# Patient Record
Sex: Male | Born: 1970 | Race: White | Hispanic: No | Marital: Married | Smoking: Never smoker
Health system: Southern US, Community
[De-identification: ages and names within clinical notes are randomized; demographics above are authoritative.]

## PROBLEM LIST (undated history)

## (undated) DIAGNOSIS — T148XXA Other injury of unspecified body region, initial encounter: Secondary | ICD-10-CM

## (undated) DIAGNOSIS — T7840XA Allergy, unspecified, initial encounter: Secondary | ICD-10-CM

## (undated) DIAGNOSIS — F419 Anxiety disorder, unspecified: Secondary | ICD-10-CM

## (undated) DIAGNOSIS — J309 Allergic rhinitis, unspecified: Secondary | ICD-10-CM

## (undated) DIAGNOSIS — E785 Hyperlipidemia, unspecified: Secondary | ICD-10-CM

## (undated) DIAGNOSIS — L309 Dermatitis, unspecified: Secondary | ICD-10-CM

## (undated) DIAGNOSIS — K649 Unspecified hemorrhoids: Secondary | ICD-10-CM

## (undated) HISTORY — DX: Allergy, unspecified, initial encounter: T78.40XA

## (undated) HISTORY — DX: Dermatitis, unspecified: L30.9

## (undated) HISTORY — DX: Anxiety disorder, unspecified: F41.9

## (undated) HISTORY — DX: Allergic rhinitis, unspecified: J30.9

## (undated) HISTORY — DX: Other injury of unspecified body region, initial encounter: T14.8XXA

## (undated) HISTORY — DX: Unspecified hemorrhoids: K64.9

## (undated) HISTORY — PX: WISDOM TOOTH EXTRACTION: SHX21

## (undated) HISTORY — DX: Hyperlipidemia, unspecified: E78.5

---

## 1999-01-04 ENCOUNTER — Emergency Department (HOSPITAL_COMMUNITY): Admission: EM | Admit: 1999-01-04 | Discharge: 1999-01-04 | Payer: Self-pay | Admitting: Emergency Medicine

## 2003-08-25 HISTORY — PX: KNEE SURGERY: SHX244

## 2004-04-02 ENCOUNTER — Ambulatory Visit (HOSPITAL_BASED_OUTPATIENT_CLINIC_OR_DEPARTMENT_OTHER): Admission: RE | Admit: 2004-04-02 | Discharge: 2004-04-02 | Payer: Self-pay | Admitting: Orthopedic Surgery

## 2009-02-21 HISTORY — PX: ANTERIOR CRUCIATE LIGAMENT REPAIR: SHX115

## 2009-08-24 HISTORY — PX: VASECTOMY: SHX75

## 2014-08-02 ENCOUNTER — Other Ambulatory Visit (HOSPITAL_COMMUNITY): Payer: Self-pay | Admitting: Internal Medicine

## 2014-08-02 DIAGNOSIS — R0789 Other chest pain: Secondary | ICD-10-CM

## 2014-08-22 ENCOUNTER — Telehealth (HOSPITAL_COMMUNITY): Payer: Self-pay

## 2014-08-22 NOTE — Telephone Encounter (Signed)
Encounter complete. 

## 2014-08-28 ENCOUNTER — Ambulatory Visit (HOSPITAL_COMMUNITY)
Admission: RE | Admit: 2014-08-28 | Discharge: 2014-08-28 | Disposition: A | Discharge status: Home / Self Care | Payer: 59 | Source: Ambulatory Visit | Attending: Cardiovascular Disease | Admitting: Cardiovascular Disease

## 2014-08-28 DIAGNOSIS — R079 Chest pain, unspecified: Secondary | ICD-10-CM | POA: Diagnosis present

## 2014-08-28 DIAGNOSIS — R0789 Other chest pain: Secondary | ICD-10-CM

## 2014-08-28 NOTE — Procedures (Signed)
Exercise Treadmill Test  Test  Exercise Tolerance Test Ordering MD: Marton Redwood, MD  Interpreting MD:   Unique Test No: 1 Treadmill:  1  Indication for ETT: chest pain - rule out ischemia  Contraindication to ETT: No   Stress Modality: exercise - treadmill  Cardiac Imaging Performed: non   Protocol: standard Bruce - maximal  Max BP:  169/88  Max MPHR (bpm):  177 85% MPR (bpm):  150  MPHR obtained (bpm):  173 % MPHR obtained:  97  Reached 85% MPHR (min:sec):  9:55 Total Exercise Time (min-sec):  13:36  Workload in METS:  16.40 Borg Scale:   Reason ETT Terminated:  fatigue    ST Segment Analysis At Rest: NSR with no ST changes With Exercise: no evidence of significant ST depression  Other Information Arrhythmia:  Rare PVC. Angina during ETT:  absent (0) Quality of ETT:  diagnostic  ETT Interpretation:  normal - no evidence of ischemia by ST analysis  ETT with good exercise tolerance (13:36); normal BP response; no chest pain; no ST changes; negative adequate ETT.  Kirk Ruths

## 2014-09-10 ENCOUNTER — Encounter: Payer: Self-pay | Admitting: Neurology

## 2014-09-11 ENCOUNTER — Encounter: Payer: Self-pay | Admitting: Neurology

## 2014-09-11 ENCOUNTER — Ambulatory Visit (INDEPENDENT_AMBULATORY_CARE_PROVIDER_SITE_OTHER): Payer: 59 | Admitting: Neurology

## 2014-09-11 VITALS — BP 113/67 | HR 73 | Temp 97.9°F | Resp 180 | Ht 74.0 in | Wt 245.0 lb

## 2014-09-11 DIAGNOSIS — G479 Sleep disorder, unspecified: Secondary | ICD-10-CM

## 2014-09-11 DIAGNOSIS — R51 Headache: Secondary | ICD-10-CM

## 2014-09-11 DIAGNOSIS — G478 Other sleep disorders: Secondary | ICD-10-CM

## 2014-09-11 DIAGNOSIS — G4733 Obstructive sleep apnea (adult) (pediatric): Secondary | ICD-10-CM

## 2014-09-11 DIAGNOSIS — R519 Headache, unspecified: Secondary | ICD-10-CM

## 2014-09-11 NOTE — Progress Notes (Signed)
Subjective:    Patient ID: Robert Berg is a 44 y.o. male.  HPI     Star Age, MD, PhD [facility] [address] [place], [place] [address]  Dear Dr. Brigitte Pulse,  I saw your patient, Robert Berg, upon your kind request in my neurologic clinic today for initial consultation of his sleep disorder, in particular, concern for underlying obstructive sleep apnea. The patient is unaccompanied today. As you know, Robert Berg is a 44 year old right-handed gentleman with an underlying medical history of anxiety, hyperlipidemia, allergic rhinitis, eczema, elevated LFTs, and obesity, who reports snoring and non-restorative sleep. He has had weight gain in the last 10 years. He feels tired and reports work related stress. His ESS is 3/24.  His father has OSA and is on a CPAP. The patient himself is not excited about the prospect of perhaps having to use a CPAP machine. He occasionally wakes up with a headache. He typically does not wake up rested. Lately he has tried to improve his sleep habits. He has reduced his caffeine intake and usually drinks about 1-2 caffeinated beverages per day. He tries not to have any caffeine after 6 PM. He usually goes to bed around 11 PM but sometimes has trouble going to sleep. He has rumination of thoughts. He is never been a good sleeper. He wakes up at 6:45 usually. He rarely has nocturia. He denies any parasomnias. He denies restless leg symptoms and is not sure if he twitches in his sleep. He does not smoke. He drinks beer or wine 4 times a week. His wife has reported witnessed breathing pauses to him. He also has woken up with a sense of gasping for air.   His Past Medical History Is Significant For: Past Medical History  Diagnosis Date  . Hyperlipemia   . Allergic rhinitis   . Eczema   . Hemorrhoids   . Anxiety   . Fracture     arm, finger, collarbone, ankle(left)    His Past Surgical History Is Significant For: Past  Surgical History  Procedure Laterality Date  . Knee surgery Right 2005    left knee, 2004  . Wisdom tooth extraction    . Anterior cruciate ligament repair  07/10    torn  . Vasectomy  1/11    His Family History Is Significant For: Family History  Problem Relation Age of Onset  . Suicidality Brother   . Hyperlipidemia Father   . Hypertension Father   . Osteoarthritis Father   . Sleep apnea Father   . Parkinson's disease Father   . Depression Mother   . Cancer Maternal Grandmother     stomach  . Microcephaly Paternal Grandmother   . Microcephaly Paternal Grandmother     His Social History Is Significant For: History   Social History  . Marital Status: Married    Spouse Name: Micheala    Number of Children: 3  . Years of Education: 19   Occupational History  .      [employer]   Social History Main Topics  . Smoking status: Never Smoker   . Smokeless tobacco: Never Used  . Alcohol Use: 0.0 oz/week    0 Not specified per week     Comment: socially  . Drug Use: No  . Sexual Activity: None   Other Topics Concern  . None   Social History Narrative   Patient consumes 2 cups of caffeine daily  His Allergies Are:  Allergies  Allergen Reactions  . Seasonal Ic [Cholestatin]     Sinuses, cough, runny nose  :   His Current Medications Are:  Outpatient Encounter Prescriptions as of 09/11/2014  Medication Sig  . fluticasone (FLONASE) 50 MCG/ACT nasal spray Place 2 sprays into both nostrils daily.  . hydrocortisone (ANUSOL-HC) 2.5 % rectal cream Place 1 application rectally 2 (two) times daily.  Marland Kitchen loratadine (CLARITIN) 10 MG tablet Take 10 mg by mouth daily. As needed  . rosuvastatin (CRESTOR) 20 MG tablet Take 20 mg by mouth daily. 1/2 tablet at bedtime  . [DISCONTINUED] Lidocaine-Hydrocortisone Ace (ANAMANTLE HC RE) Place rectally. Apply 1 supp. Per rectum twice daily 14 days , then as needed  :  Review of Systems:  Out of a complete 14  point review of systems, all are reviewed and negative with the exception of these symptoms as listed below:   Review of Systems  Allergic/Immunologic: Positive for environmental allergies.  Neurological:       Snoring  Psychiatric/Behavioral:       Decreased energy    Objective:  Neurologic Exam  Physical Exam Physical Examination:   Filed Vitals:   09/11/14 0834  BP: 113/67  Pulse: 73  Temp: 97.9 F (36.6 C)  Resp: 180    General Examination: The patient is a very pleasant 44 y.o. male in no acute distress. He appears well-developed and well-nourished and well groomed.   HEENT: Normocephalic, atraumatic, pupils are equal, round and reactive to light and accommodation. Funduscopic exam is normal with sharp disc margins noted. Extraocular tracking is good without limitation to gaze excursion or nystagmus noted. Normal smooth pursuit is noted. Hearing is grossly intact. Tympanic membranes are clear bilaterally. Face is symmetric with normal facial animation and normal facial sensation. Speech is clear with no dysarthria noted. There is no hypophonia. There is no lip, neck/head, jaw or voice tremor. Neck is supple with full range of passive and active motion. There are no carotid bruits on auscultation. Oropharynx exam reveals: mild mouth dryness, good dental hygiene and moderate airway crowding, due to narrow airway entry, and larger tongue, tonsils are 1+ bilaterally and uvula is actually small . Mallampati is class II. Tongue protrudes centrally and palate elevates symmetrically.  Neck is 18-1/2 inches. Nasal inspection reveals no significant nasal mucosal bogginess or redness, but he has septal deviation to the right.  Chest: Clear to auscultation without wheezing, rhonchi or crackles noted.  Heart: S1+S2+0, regular and normal without murmurs, rubs or gallops noted.   Abdomen: Soft, non-tender and non-distended with normal bowel sounds appreciated on auscultation.  Extremities:  There is no pitting edema in the distal lower extremities bilaterally. Pedal pulses are intact.  Skin: Warm and dry without trophic changes noted. There are no varicose veins.  Musculoskeletal: exam reveals no obvious joint deformities, tenderness or joint swelling or erythema.   Neurologically:  Mental status: The patient is awake, alert and oriented in all 4 spheres. His immediate and remote memory, attention, language skills and fund of knowledge are appropriate. There is no evidence of aphasia, agnosia, apraxia or anomia. Speech is clear with normal prosody and enunciation. Thought process is linear. Mood is normal and affect is normal.  Cranial nerves II - XII are as described above under HEENT exam. In addition: shoulder shrug is normal with equal shoulder height noted. Motor exam: Normal bulk, strength and tone is noted. There is no drift, tremor or rebound. Romberg is negative. Reflexes are 2+  throughout. Babinski: Toes are flexor bilaterally. Fine motor skills and coordination: intact with normal finger taps, normal hand movements, normal rapid alternating patting, normal foot taps and normal foot agility.  Cerebellar testing: No dysmetria or intention tremor on finger to nose testing. Heel to shin is unremarkable bilaterally. There is no truncal or gait ataxia.  Sensory exam: intact to light touch, pinprick, vibration, temperature sense proprioception in the upper and lower extremities.  Gait, station and balance: He stands easily. No veering to one side is noted. No leaning to one side is noted. Posture is age-appropriate and stance is narrow based. Gait shows normal stride length and normal pace. No problems turning are noted. He turns en bloc. Tandem walk is unremarkable.    Assessment and Plan:   In summary, Robert Berg is a very pleasant 44 y.o.-year old male  with an underlying medical history of anxiety, hyperlipidemia, allergic rhinitis, eczema, elevated LFTs, and obesity, who  reports snoring, , witnessed apneas, and occasional morning headaches. His history, family history and physical exam are in keeping with obstructive sleep apnea (OSA). I had a long chat with the patient about my findings and the diagnosis of OSA, its prognosis and treatment options. We talked about medical treatments, surgical interventions and non-pharmacological approaches. I explained in particular the risks and ramifications of untreated moderate to severe OSA, especially with respect to developing cardiovascular disease down the Road, including congestive heart failure, difficult to treat hypertension, cardiac arrhythmias, or stroke. Even type 2 diabetes has, in part, been linked to untreated OSA. Symptoms of untreated OSA include daytime sleepiness, memory problems, mood irritability and mood disorder such as depression and anxiety, lack of energy, as well as recurrent headaches, especially morning headaches. We talked about trying to maintain a healthy lifestyle in general, as well as the importance of weight control. I encouraged the patient to eat healthy, exercise daily and keep well hydrated, to keep a scheduled bedtime and wake time routine, to not skip any meals and eat healthy snacks in between meals. I advised the patient not to drive when feeling sleepy. I recommended the following at this time: sleep study with potential positive airway pressure titration. (We will score hypopneas at 4% and split the sleep study into diagnostic and treatment portion, if the estimated. 2 hour AHI is >20/h).   I explained the sleep test procedure to the patient and also outlined possible surgical and non-surgical treatment options of OSA, including the use of a custom-made dental device (which would require a referral to a specialist dentist or oral surgeon), upper airway surgical options, such as pillar implants, radiofrequency surgery, tongue base surgery, and UPPP (which would involve a referral to an ENT  surgeon). Rarely, jaw surgery such as mandibular advancement may be considered.  I also explained the CPAP treatment option to the patient, who indicated that he would be reluctant but willing to at least try CPAP if the need arises. I explained the importance of being compliant with PAP treatment, not only for insurance purposes but primarily to improve His symptoms, and for the patient's long term health benefit, including to reduce His cardiovascular risks. I answered all his questions today and the patient was in agreement. I would like to see him back after the sleep study is completed and encouraged him to call with any interim questions, concerns, problems or updates.   Thank you very much for allowing me to participate in the care of this nice patient. If I can be  of any further assistance to you please do not hesitate to call me at [phone].  Sincerely,   Star Age, MD, PhD

## 2014-09-11 NOTE — Patient Instructions (Signed)

## 2014-10-09 ENCOUNTER — Ambulatory Visit (INDEPENDENT_AMBULATORY_CARE_PROVIDER_SITE_OTHER): Payer: 59 | Admitting: Neurology

## 2014-10-09 DIAGNOSIS — G4761 Periodic limb movement disorder: Secondary | ICD-10-CM

## 2014-10-09 DIAGNOSIS — G479 Sleep disorder, unspecified: Secondary | ICD-10-CM

## 2014-10-09 DIAGNOSIS — G4733 Obstructive sleep apnea (adult) (pediatric): Secondary | ICD-10-CM

## 2014-10-09 NOTE — Sleep Study (Signed)
Please see the scanned sleep study interpretation located in the Procedure tab within the Chart Review section. 

## 2014-10-17 ENCOUNTER — Telehealth: Payer: Self-pay | Admitting: Neurology

## 2014-10-17 NOTE — Telephone Encounter (Signed)
Please call and notify the patient that the recent sleep study showed overall mild obstructive sleep apnea and also leg kicking in sleep. Please inform patient that I would like to go over the details of the study and potential treatment options during a follow up appointment and if not already previously scheduled, arrange a followup appointment (please utilize a followu-up slot). Also, route or fax report to PCP and referring MD, if other than PCP.  Once you have spoken to patient, you can close this encounter.   Thanks,  Star Age, MD, PhD [facility])

## 2014-10-18 ENCOUNTER — Encounter: Payer: Self-pay | Admitting: Neurology

## 2014-10-23 NOTE — Telephone Encounter (Signed)
Called and left the patient a detailed message that Dr. Rexene Alberts wanted to see him in follow up to go over the details of his overnight sleep study.  Patient was instructed to call the office to do so.  Dr. Marton Redwood was faxed a copy of the results.

## 2014-10-24 ENCOUNTER — Telehealth: Payer: Self-pay | Admitting: *Deleted

## 2014-10-24 NOTE — Telephone Encounter (Signed)
Patient notified via VM to contact office to schedule an appointment with Dr. Rexene Alberts to go over the results of his sleep study.

## 2016-09-04 ENCOUNTER — Other Ambulatory Visit: Payer: Self-pay | Admitting: Internal Medicine

## 2016-09-04 DIAGNOSIS — R911 Solitary pulmonary nodule: Secondary | ICD-10-CM

## 2020-02-05 ENCOUNTER — Other Ambulatory Visit: Payer: Self-pay

## 2020-02-05 ENCOUNTER — Encounter: Payer: Self-pay | Admitting: Family Medicine

## 2020-02-05 ENCOUNTER — Ambulatory Visit: Payer: Self-pay

## 2020-02-05 ENCOUNTER — Ambulatory Visit (INDEPENDENT_AMBULATORY_CARE_PROVIDER_SITE_OTHER): Payer: 59 | Admitting: Family Medicine

## 2020-02-05 VITALS — BP 140/90 | HR 75 | Ht 74.0 in | Wt 243.0 lb

## 2020-02-05 DIAGNOSIS — M79605 Pain in left leg: Secondary | ICD-10-CM | POA: Diagnosis not present

## 2020-02-05 DIAGNOSIS — M7662 Achilles tendinitis, left leg: Secondary | ICD-10-CM | POA: Diagnosis not present

## 2020-02-05 MED ORDER — NITROGLYCERIN 0.2 MG/HR TD PT24: MEDICATED_PATCH | TRANSDERMAL | 1 refills | Status: DC

## 2020-02-05 NOTE — Progress Notes (Signed)
    Subjective:    CC: Left leg pain   I, Judy Pimple, am serving as a scribe for Dr. Lynne Leader.  HPI: patient is a 49 year old male presenting to [facility] for Left leg pain. Patient plays a lot of tennis  May 20th hurt his leg was sore in heel area and achilles tendon. Tried to play on the 22nd but hobbled around, played on may 23rd and on the 24th. On the 24th left leg pulled like a rubber band from achilles, calf and hamstring. Patient went in hot tub and a roller and stopped playing tennis. Achilles was tender to the touch. On June 5th tried to do a tennis clinic and could not explode off left foot. Patient was recommended to our office. Patient states ice is helpful but wants to know what he needs to do now, not sure if anything is torn.   Pertinent review of Systems: No fevers or chills  Relevant historical information: Hyperlipidemia   Objective:    Vitals:   02/05/20 1424  BP: 140/90  Pulse: 75  SpO2: 97%   General: Well Developed, well nourished, and in no acute distress.   MSK: Left Achilles tendon normal-appearing not particular tender palpation normal motion normal strength.  Lab and Radiology Results  Diagnostic Limited MSK Ultrasound of: Left Achilles tendon Achilles tendon insertion on calcaneus normal-appearing measuring 0.46 cm.  Hyperechoic calcification seen at distal tendon insertion consistent with heel spur. About 4 cm proximal to insertion of calcaneus tendon widens measuring 1 cm consistent in appearance with tendon nodule. No visible tear seen at nodule.  Normal appearance of Achilles tendon and origin at muscle tendinous junction gastrocnemius Impression: Achilles tendon nodule    Impression and Recommendations:    Assessment and Plan: 49 y.o. male with left heel pain significant for Achilles tendon nodule/tendinitis.  No obvious tear on ultrasound today.  Plan for nitroglycerin patch protocol and home exercise  program.  Recheck back in a month.Marland Kitchen  PDMP not reviewed this encounter. Orders Placed This Encounter  Procedures  . [place] LIMITED JOINT SPACE STRUCTURES LOW LEFT    Standing Status:   Future    Number of Occurrences:   1    Standing Expiration Date:   02/04/2021    Order Specific Question:   Reason for Exam (SYMPTOM  OR DIAGNOSIS REQUIRED)    Answer:   Left leg pain    Order Specific Question:   Preferred imaging location?    Answer:   [facility]   Meds ordered this encounter  Medications  . nitroGLYCERIN (NITRODUR - DOSED IN MG/24 HR) 0.2 mg/hr patch    Sig: Apply 1/4 patch daily to tendon for tendonitis.    Dispense:  30 patch    Refill:  1    Discussed warning signs or symptoms. Please see discharge instructions. Patient expresses understanding.   The above documentation has been reviewed and is accurate and complete Lynne Leader, M.D.

## 2020-02-05 NOTE — Patient Instructions (Signed)
Thank you for coming in today.  Plan for exercises and nitro patches.  Do 30 reps 2-3x daily Remember to go from up to down slowly.  Recheck in about 1 month.   Nitroglycerin Protocol   Apply 1/4 nitroglycerin patch to affected area daily.  Change position of patch within the affected area every 24 hours.  You may experience a headache during the first 1-2 weeks of using the patch, these should subside.  If you experience headaches after beginning nitroglycerin patch treatment, you may take your preferred over the counter pain reliever.  Another side effect of the nitroglycerin patch is skin irritation or rash related to patch adhesive.  Please notify our office if you develop more severe headaches or rash, and stop the patch.  Tendon healing with nitroglycerin patch may require 12 to 24 weeks depending on the extent of injury.  Men should not use if taking Viagra, Cialis, or Levitra.   Do not use if you have migraines or rosacea.    Rosen's Emergency Medicine: Concepts and Clinical Practice (9th ed., pp. 7253-6644). [place], [place]: [place]. Retrieved from https://www.clinicalkey.com/#!/content/book/3-s2.0-B9780323354790001070?scrollTo=%23hl0000251">  Achilles Tendinitis  Achilles tendinitis is inflammation of the tough, cord-like band that attaches the lower leg muscles to the heel bone (Achilles tendon). This is usually caused by overusing the tendon and the ankle joint. Achilles tendinitis usually gets better over time with treatment and caring for yourself at home. It can take weeks or months to heal completely. What are the causes? This condition may be caused by:  A sudden increase in exercise or activity, such as running.  Doing the same exercises or activities, such as jumping, over and over.  Not warming up calf muscles before exercising.  Exercising in shoes that are worn out or not made for exercise.  Having arthritis or a bone growth (spur) on the back  of the heel bone. This can rub against the tendon and hurt it.  Age-related wear and tear. Tendons become less flexible with age and are more likely to be injured. What are the signs or symptoms? Common symptoms of this condition include:  Pain in the Achilles tendon or in the back of the leg, just above the heel. The pain usually gets worse with exercise.  Stiffness or soreness in the back of the leg, especially in the morning.  Swelling of the skin over the Achilles tendon.  Thickening of the tendon.  Trouble standing on tiptoe. How is this diagnosed? This condition is diagnosed based on your symptoms and a physical exam. You may have tests, including:  X-rays.  MRI. How is this treated? The goal of treatment is to relieve symptoms and help your injury heal. Treatment may include:  Decreasing or stopping activities that caused the tendinitis. This may mean switching to low-impact exercises like biking or swimming.  Icing the injured area.  Doing physical therapy, including strengthening and stretching exercises.  Taking NSAIDs, such as ibuprofen, to help relieve pain and swelling.  Using supportive shoes, wraps, heel lifts, or a walking boot (air cast).  Having surgery. This may be done if your symptoms do not improve after other treatments.  Using high-energy shock wave impulses to stimulate the healing process (extracorporeal shock wave therapy). This is rare.  Having an injection of medicines that help relieve inflammation (corticosteroids). This is rare. Follow these instructions at home: If you have an air cast:  Wear the air cast as told by your health care provider. Remove it only as told  by your health care provider.  Loosen it if your toes tingle, become numb, or turn cold and blue.  Keep it clean.  If the air cast is not waterproof: ? Do not let it get wet. ? Cover it with a watertight covering when you take a bath or shower. Managing pain, stiffness,  and swelling   If directed, put ice on the injured area. To do this: ? If you have a removable air cast, remove it as told by your health care provider. ? Put ice in a plastic bag. ? Place a towel between your skin and the bag. ? Leave the ice on for 20 minutes, 2-3 times a day.  Move your toes often to reduce stiffness and swelling.  Raise (elevate) your foot above the level of your heart while you are sitting or lying down. Activity  Gradually return to your normal activities as told by your health care provider. Ask your health care provider what activities are safe for you.  Do not do activities that cause pain.  Consider doing low-impact exercises, like cycling or swimming.  Ask your health care provider when it is safe to drive if you have an air cast on your foot.  If physical therapy was prescribed, do exercises as told by your health care provider or physical therapist. General instructions  If directed, wrap your foot with an elastic bandage or other wrap. This can help to keep your tendon from moving too much while it heals. Your health care provider will show you how to wrap your foot correctly.  Wear supportive shoes or heel lifts only as told by your health care provider.  Take over-the-counter and prescription medicines only as told by your health care provider.  Keep all follow-up visits as told by your health care provider. This is important. Contact a health care provider if you:  Have symptoms that get worse.  Have pain that does not get better with medicine.  Develop new, unexplained symptoms.  Develop warmth and swelling in your foot.  Have a fever. Get help right away if you:  Have a sudden popping sound or sensation in your Achilles tendon followed by severe pain.  Cannot move your toes or foot.  Cannot put any weight on your foot.  Your foot or toes become numb and look white or blue even after loosening your bandage or air  cast. Summary  Achilles tendinitis is inflammation of the tough, cord-like band that attaches the lower leg muscles to the heel bone (Achilles tendon).  This condition is usually caused by overusing the tendon and the ankle joint. It can also be caused by arthritis or normal aging.  The most common symptoms of this condition include pain, swelling, or stiffness in the Achilles tendon or in the back of the leg.  This condition is usually treated by decreasing or stopping activities that caused the tendinitis, icing the injured area, taking NSAIDs, and doing physical therapy. This information is not intended to replace advice given to you by your health care provider. Make sure you discuss any questions you have with your health care provider. Document Revised: 12/26/2018 Document Reviewed: 12/26/2018 Elsevier Patient Education  [place].

## 2020-03-06 ENCOUNTER — Encounter: Payer: Self-pay | Admitting: Family Medicine

## 2020-03-06 ENCOUNTER — Other Ambulatory Visit: Payer: Self-pay

## 2020-03-06 ENCOUNTER — Ambulatory Visit (INDEPENDENT_AMBULATORY_CARE_PROVIDER_SITE_OTHER): Payer: 59 | Admitting: Family Medicine

## 2020-03-06 ENCOUNTER — Ambulatory Visit: Payer: Self-pay

## 2020-03-06 VITALS — BP 100/74 | HR 66 | Ht 74.0 in | Wt 248.6 lb

## 2020-03-06 DIAGNOSIS — M7661 Achilles tendinitis, right leg: Secondary | ICD-10-CM

## 2020-03-06 DIAGNOSIS — M7662 Achilles tendinitis, left leg: Secondary | ICD-10-CM

## 2020-03-06 NOTE — Patient Instructions (Signed)
Thank you for coming in today. Keep working on the exercises.  Ok to use the nitro patches off and on.  Recheck back with me as needed for this issue or other MSK issues.

## 2020-03-06 NOTE — Progress Notes (Signed)
   I, Wendy Poet, LAT, ATC, am serving as scribe for Dr. Lynne Leader.  Robert Berg is a 49 y.o. male who presents to [place] at [facility] today for f/u of L leg (Achille's, calf, hamstring) pain.  He was last seen by Dr. Georgina Snell on 02/05/20 after having injured himself while playing tennis.  He was prescribed nitroglycerin patches and was shown Alfredson's exercises per Dr. Georgina Snell.  Since his last visit, pt reports that his L leg is feeling much better.  However, now he reports having issues in his R Achille's.  He has resumed playing tennis, 2x last week and 2x this week.  The R Achille's began bothering him this week.  He is no longer using the nitro patches as his symptoms improved.   Pertinent review of systems: No fevers or chills  Relevant historical information: History anxiety   Exam:  BP 100/74 (BP Location: Left Arm, Patient Position: Sitting, Cuff Size: Large)   Pulse 66   Ht 6\' 2"  (1.88 m)   Wt 248 lb 9.6 oz (112.8 kg)   SpO2 99%   BMI 31.92 kg/m  General: Well Developed, well nourished, and in no acute distress.   MSK: Right Achilles tendon with small visible nodule just proximal to Achilles tendon insertion.  Nontender normal ankle motion and strength.  Left Achilles tendon with small visible nodule just proximal to the Achilles tendon insertion.  Nontender normal ankle motion and strength.    Lab and Radiology Results Diagnostic Limited MSK Ultrasound of: Bilateral Achilles tendon Small nodule increased thickness at Achilles tendon proximal to insertion left worse than right.  No tendon disruption or significant increase Doppler activity. Impression: Achilles tendon nodule bilaterally left worse than right     Assessment and Plan: 48 y.o. 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  Improved with eccentric exercises and nitroglycerin patch protocol.  Stressed the importance of ongoing continued exercise and use of nitro  patches if symptoms worsen again in the future.  Recheck back as needed.   PDMP not reviewed this encounter. Orders Placed This Encounter  Procedures  . [place] LIMITED JOINT SPACE STRUCTURES LOW RIGHT(NO LINKED CHARGES)    Order Specific Question:   Reason for Exam (SYMPTOM  OR DIAGNOSIS REQUIRED)    Answer:   R Achille's pain    Order Specific Question:   Preferred imaging location?    Answer:   [place]   No orders of the defined types were placed in this encounter.    Discussed warning signs or symptoms. Please see discharge instructions. Patient expresses understanding.   The above documentation has been reviewed and is accurate and complete Lynne Leader, M.D.

## 2020-05-05 ENCOUNTER — Encounter (HOSPITAL_COMMUNITY): Payer: Self-pay | Admitting: Emergency Medicine

## 2020-05-05 ENCOUNTER — Emergency Department (HOSPITAL_COMMUNITY)
Admission: EM | Admit: 2020-05-05 | Discharge: 2020-05-05 | Disposition: A | Discharge status: Home / Self Care | Payer: 59 | Attending: Emergency Medicine | Admitting: Emergency Medicine

## 2020-05-05 ENCOUNTER — Emergency Department (HOSPITAL_COMMUNITY): Payer: 59

## 2020-05-05 ENCOUNTER — Other Ambulatory Visit: Payer: Self-pay

## 2020-05-05 DIAGNOSIS — W19XXXA Unspecified fall, initial encounter: Secondary | ICD-10-CM | POA: Insufficient documentation

## 2020-05-05 DIAGNOSIS — M549 Dorsalgia, unspecified: Secondary | ICD-10-CM | POA: Insufficient documentation

## 2020-05-05 DIAGNOSIS — Z5321 Procedure and treatment not carried out due to patient leaving prior to being seen by health care provider: Secondary | ICD-10-CM | POA: Insufficient documentation

## 2020-05-05 DIAGNOSIS — Y9373 Activity, racquet and hand sports: Secondary | ICD-10-CM | POA: Diagnosis not present

## 2020-05-05 DIAGNOSIS — Y929 Unspecified place or not applicable: Secondary | ICD-10-CM | POA: Diagnosis not present

## 2020-05-05 DIAGNOSIS — Y999 Unspecified external cause status: Secondary | ICD-10-CM | POA: Diagnosis not present

## 2020-05-05 NOTE — ED Notes (Signed)
According to Dr. Joylene Draft; with whom our C.N. spoke, pt. Was to have an outpatient x-ray ONLY--no E.D. visit. I will dismiss this chart accordingly now.

## 2020-05-05 NOTE — ED Triage Notes (Signed)
Patient reports falling on left side of back while playing tennis. Reports left side back pain. "I think I have broken ribs".

## 2020-08-29 DIAGNOSIS — Z125 Encounter for screening for malignant neoplasm of prostate: Secondary | ICD-10-CM | POA: Diagnosis not present

## 2020-08-29 DIAGNOSIS — Z0000 Encounter for general adult medical examination without abnormal findings: Secondary | ICD-10-CM | POA: Diagnosis not present

## 2020-08-29 DIAGNOSIS — E785 Hyperlipidemia, unspecified: Secondary | ICD-10-CM | POA: Diagnosis not present

## 2020-09-04 DIAGNOSIS — E785 Hyperlipidemia, unspecified: Secondary | ICD-10-CM | POA: Diagnosis not present

## 2020-09-04 DIAGNOSIS — Z1339 Encounter for screening examination for other mental health and behavioral disorders: Secondary | ICD-10-CM | POA: Diagnosis not present

## 2020-09-04 DIAGNOSIS — Z23 Encounter for immunization: Secondary | ICD-10-CM | POA: Diagnosis not present

## 2020-09-04 DIAGNOSIS — Z1331 Encounter for screening for depression: Secondary | ICD-10-CM | POA: Diagnosis not present

## 2020-09-04 DIAGNOSIS — Z0000 Encounter for general adult medical examination without abnormal findings: Secondary | ICD-10-CM | POA: Diagnosis not present

## 2020-09-04 DIAGNOSIS — R82998 Other abnormal findings in urine: Secondary | ICD-10-CM | POA: Diagnosis not present

## 2020-09-04 DIAGNOSIS — R7401 Elevation of levels of liver transaminase levels: Secondary | ICD-10-CM | POA: Diagnosis not present

## 2020-09-04 DIAGNOSIS — Z1212 Encounter for screening for malignant neoplasm of rectum: Secondary | ICD-10-CM | POA: Diagnosis not present

## 2021-04-07 ENCOUNTER — Encounter: Payer: Self-pay | Admitting: Gastroenterology

## 2021-04-29 ENCOUNTER — Other Ambulatory Visit: Payer: Self-pay

## 2021-04-29 ENCOUNTER — Ambulatory Visit (AMBULATORY_SURGERY_CENTER): Payer: BC Managed Care – PPO | Admitting: *Deleted

## 2021-04-29 VITALS — Ht 74.0 in | Wt 240.0 lb

## 2021-04-29 DIAGNOSIS — Z1211 Encounter for screening for malignant neoplasm of colon: Secondary | ICD-10-CM

## 2021-04-29 MED ORDER — NA SULFATE-K SULFATE-MG SULF 17.5-3.13-1.6 GM/177ML PO SOLN: 1.0000 | ORAL | 0 refills | Status: DC

## 2021-04-29 NOTE — Progress Notes (Signed)
Patient's pre-visit was done today over the phone with the patient due to COVID-19 pandemic. Name,DOB and address verified. Insurance verified. Patient denies any allergies to Eggs and Soy. Patient denies any problems with anesthesia/sedation. Patient denies taking diet pills or blood thinners. No home Oxygen. Packet of Prep instructions mailed to patient including a copy of a consent form-pt is aware. Patient understands to call us back with any questions or concerns. Patient is aware of our care-partner policy and 0000000 safety protocol.   The patient is COVID-19 vaccinated.

## 2021-05-02 ENCOUNTER — Encounter: Payer: Self-pay | Admitting: Gastroenterology

## 2021-05-13 ENCOUNTER — Other Ambulatory Visit: Payer: Self-pay

## 2021-05-13 ENCOUNTER — Ambulatory Visit (AMBULATORY_SURGERY_CENTER): Payer: BC Managed Care – PPO | Admitting: Gastroenterology

## 2021-05-13 ENCOUNTER — Encounter: Payer: Self-pay | Admitting: Gastroenterology

## 2021-05-13 VITALS — BP 118/82 | HR 65 | Temp 97.1°F | Resp 17 | Ht 74.0 in | Wt 240.0 lb

## 2021-05-13 DIAGNOSIS — D122 Benign neoplasm of ascending colon: Secondary | ICD-10-CM | POA: Diagnosis not present

## 2021-05-13 DIAGNOSIS — D123 Benign neoplasm of transverse colon: Secondary | ICD-10-CM | POA: Diagnosis not present

## 2021-05-13 DIAGNOSIS — Z1211 Encounter for screening for malignant neoplasm of colon: Secondary | ICD-10-CM | POA: Diagnosis not present

## 2021-05-13 MED ORDER — SODIUM CHLORIDE 0.9 % IV SOLN: 500.0000 mL | Freq: Once | INTRAVENOUS | Status: DC

## 2021-05-13 NOTE — Op Note (Signed)
[place] Patient Name: Robert Berg Procedure Date: 05/13/2021 8:30 AM MRN: [id number] Endoscopist: Ladene Artist , MD Age: 50 Referring MD:  Date of Birth: Aug 18, 1971 Gender: Male Account #: [id number] Procedure:                Colonoscopy Indications:              Screening for colorectal malignant neoplasm Medicines:                Monitored Anesthesia Care Procedure:                Pre-Anesthesia Assessment:                           - Prior to the procedure, a History and Physical                            was performed, and patient medications and                            allergies were reviewed. The patient's tolerance of                            previous anesthesia was also reviewed. The risks                            and benefits of the procedure and the sedation                            options and risks were discussed with the patient.                            All questions were answered, and informed consent                            was obtained. Prior Anticoagulants: The patient has                            taken no previous anticoagulant or antiplatelet                            agents. ASA Grade Assessment: II - A patient with                            mild systemic disease. After reviewing the risks                            and benefits, the patient was deemed in                            satisfactory condition to undergo the procedure.                           After obtaining informed consent, the colonoscope  was passed under direct vision. Throughout the                            procedure, the patient's blood pressure, pulse, and                            oxygen saturations were monitored continuously. The                            CF [id number] #[id number] was introduced through the anus                            and advanced to the the cecum, identified by                            appendiceal orifice  and ileocecal valve. The                            ileocecal valve, appendiceal orifice, and rectum                            were photographed. The quality of the bowel                            preparation was excellent. The colonoscopy was                            performed without difficulty. The patient tolerated                            the procedure well. Scope In: 8:36:46 AM Scope Out: 8:48:55 AM Scope Withdrawal Time: 0 hours 10 minutes 12 seconds  Total Procedure Duration: 0 hours 12 minutes 9 seconds  Findings:                 The perianal and digital rectal examinations were                            normal.                           Two sessile polyps were found in the transverse                            colon and ascending colon. The polyps were 7 mm in                            size. These polyps were removed with a cold snare.                            Resection and retrieval were complete.                           A few small-mouthed diverticula were found in the  left colon. There was no evidence of diverticular                            bleeding.                           Internal hemorrhoids were found during                            retroflexion. The hemorrhoids were small and Grade                            I (internal hemorrhoids that do not prolapse).                           The exam was otherwise without abnormality on                            direct and retroflexion views. Complications:            No immediate complications. Estimated blood loss:                            None. Estimated Blood Loss:     Estimated blood loss: none. Impression:               - Two 7 mm polyps in the transverse colon and in                            the ascending colon, removed with a cold snare.                            Resected and retrieved.                           - Mild diverticulosis in the left colon.                            - Internal hemorrhoids.                           - The examination was otherwise normal on direct                            and retroflexion views. Recommendation:           - Repeat colonoscopy after studies are complete for                            surveillance based on pathology results.                           - Patient has a contact number available for                            emergencies. The signs and symptoms of potential  delayed complications were discussed with the                            patient. Return to normal activities tomorrow.                            Written discharge instructions were provided to the                            patient.                           - High fiber diet.                           - Continue present medications.                           - Await pathology results. Ladene Artist, MD 05/13/2021 8:52:30 AM This report has been signed electronically.

## 2021-05-13 NOTE — Progress Notes (Signed)
History & Physical  Primary Care Physician:  Ginger Organ., MD Primary Gastroenterologist: Jerilynn Mages. Fuller Plan, MD  CHIEF COMPLAINT:  CRC screening   HPI: Robert Berg is a 50 y.o. male, average risk CRC screening, who presents for colonoscopy.  No ongoing gastrointestinal complaints.  This is his first colonoscopy.   Past Medical History:  Diagnosis Date   Allergic rhinitis    Allergy    Anxiety    Eczema    Fracture    arm, finger, collarbone, ankle(left)   Hemorrhoids    Hyperlipemia     Past Surgical History:  Procedure Laterality Date   ANTERIOR CRUCIATE LIGAMENT REPAIR  07/10   torn   KNEE SURGERY Right 2005   left knee, 2004   VASECTOMY  1/11   WISDOM TOOTH EXTRACTION      Prior to Admission medications   Medication Sig Start Date End Date Taking? Authorizing Provider  loratadine (CLARITIN) 10 MG tablet Take 10 mg by mouth daily. As needed   Yes [provider]  rosuvastatin (CRESTOR) 20 MG tablet Take 20 mg by mouth daily. 1/2 tablet at bedtime   Yes [provider]  fluticasone (FLONASE) 50 MCG/ACT nasal spray Place 2 sprays into both nostrils daily.    [provider]  hydrocortisone (ANUSOL-HC) 2.5 % rectal cream Place 1 application rectally 2 (two) times daily. Patient not taking: Reported on 05/13/2021    [provider]    Current Outpatient Medications  Medication Sig Dispense Refill   loratadine (CLARITIN) 10 MG tablet Take 10 mg by mouth daily. As needed     rosuvastatin (CRESTOR) 20 MG tablet Take 20 mg by mouth daily. 1/2 tablet at bedtime     fluticasone (FLONASE) 50 MCG/ACT nasal spray Place 2 sprays into both nostrils daily.     hydrocortisone (ANUSOL-HC) 2.5 % rectal cream Place 1 application rectally 2 (two) times daily. (Patient not taking: Reported on 05/13/2021)     Current Facility-Administered Medications  Medication Dose Route Frequency Provider Last Rate Last Admin   0.9 %  sodium chloride infusion   500 mL Intravenous Once Ladene Artist, MD        Allergies as of 05/13/2021 - Review Complete 05/13/2021  Allergen Reaction Noted   Seasonal ic [cholestatin]  09/11/2014    Family History  Problem Relation Age of Onset   Depression Mother    Hyperlipidemia Father    Hypertension Father    Osteoarthritis Father    Sleep apnea Father    Parkinson's disease Father    Suicidality Brother    Cancer Maternal Grandmother        stomach   Microcephaly Paternal Grandmother    Colon cancer Neg Hx    Colon polyps Neg Hx    Esophageal cancer Neg Hx    Stomach cancer Neg Hx    Rectal cancer Neg Hx     Social History   Socioeconomic History   Marital status: Married    Spouse name: Micheala   Number of children: 3   Years of education: 19   Highest education level: Not on file  Occupational History    Comment: [employer]  Tobacco Use   Smoking status: Never   Smokeless tobacco: Never  Vaping Use   Vaping Use: Never used  Substance and Sexual Activity   Alcohol use: Yes    Alcohol/week: 4.0 - 6.0 standard drinks    Types: 4 - 6 Standard drinks or  equivalent per week    Comment: socially   Drug use: No   Sexual activity: Not on file  Other Topics Concern   Not on file  Social History Narrative   Patient consumes 2 cups of caffeine daily   Social Determinants of Health   Financial Resource Strain: Not on file  Food Insecurity: Not on file  Transportation Needs: Not on file  Physical Activity: Not on file  Stress: Not on file  Social Connections: Not on file  Intimate Partner Violence: Not on file    Review of Systems:  All systems reviewed an negative except where noted in HPI.  Gen: Denies any fever, chills, sweats, anorexia, fatigue, weakness, malaise, weight loss, and sleep disorder CV: Denies chest pain, angina, palpitations, syncope, orthopnea, PND, peripheral edema, and claudication. Resp: Denies dyspnea at rest, dyspnea with  exercise, cough, sputum, wheezing, coughing up blood, and pleurisy. GI: Denies vomiting blood, jaundice, and fecal incontinence.   Denies dysphagia or odynophagia. GU : Denies urinary burning, blood in urine, urinary frequency, urinary hesitancy, nocturnal urination, and urinary incontinence. MS: Denies joint pain, limitation of movement, and swelling, stiffness, low back pain, extremity pain. Denies muscle weakness, cramps, atrophy.  Derm: Denies rash, itching, dry skin, hives, moles, warts, or unhealing ulcers.  Psych: Denies depression, anxiety, memory loss, suicidal ideation, hallucinations, paranoia, and confusion. Heme: Denies bruising, bleeding, and enlarged lymph nodes. Neuro:  Denies any headaches, dizziness, paresthesias. Endo:  Denies any problems with DM, thyroid, adrenal function.   Physical Exam: General:  Alert, well-developed, in NAD Head:  Normocephalic and atraumatic. Eyes:  Sclera clear, no icterus.   Conjunctiva pink. Ears:  Normal auditory acuity. Mouth:  No deformity or lesions.  Neck:  Supple; no masses . Lungs:  Clear throughout to auscultation.   No wheezes, crackles, or rhonchi. No acute distress. Heart:  Regular rate and rhythm; no murmurs. Abdomen:  Soft, nondistended, nontender. No masses, hepatomegaly. No obvious masses.  Normal bowel .    Rectal:  Deferred   Msk:  Symmetrical without gross deformities.. Pulses:  Normal pulses noted. Extremities:  Without edema. Neurologic:  Alert and  oriented x4;  grossly normal neurologically. Skin:  Intact without significant lesions or rashes. Cervical Nodes:  No significant cervical adenopathy. Psych:  Alert and cooperative. Normal mood and affect.   Impression / Plan:   CRC screening average risk for colonoscopy.   This patient is appropriate for endoscopic procedures in the ambulatory setting.    Pricilla Riffle. Fuller Plan  05/13/2021, 8:32 AM

## 2021-05-13 NOTE — Progress Notes (Signed)
VS- Robert Berg  Pt's states no medical or surgical changes since previsit or office visit.  

## 2021-05-13 NOTE — Progress Notes (Signed)
Called to room to assist during endoscopic procedure.  Patient ID and intended procedure confirmed with present staff. Received instructions for my participation in the procedure from the performing physician.  

## 2021-05-13 NOTE — Progress Notes (Signed)
Report to PACU, RN, vss, BBS= Clear.  

## 2021-05-13 NOTE — Progress Notes (Signed)
Vitals not carrying over

## 2021-05-13 NOTE — Progress Notes (Signed)
No problems noted in the recovery room. maw 

## 2021-05-13 NOTE — Patient Instructions (Addendum)
Handouts were given to your care partner on polyps, diverticulosis, hemorrhoids, and a high fiber diet with liberal fluid intake. You may resume your current medications today. Await biopsy results.  May take 1-3 weeks to receive pathology results. Please call if any questions or concerns.      YOU HAD AN ENDOSCOPIC PROCEDURE TODAY AT [facility]:   Refer to the procedure report that was given to you for any specific questions about what was found during the examination.  If the procedure report does not answer your questions, please call your gastroenterologist to clarify.  If you requested that your care partner not be given the details of your procedure findings, then the procedure report has been included in a sealed envelope for you to review at your convenience later.  YOU SHOULD EXPECT: Some feelings of bloating in the abdomen. Passage of more gas than usual.  Walking can help get rid of the air that was put into your GI tract during the procedure and reduce the bloating. If you had a lower endoscopy (such as a colonoscopy or flexible sigmoidoscopy) you may notice spotting of blood in your stool or on the toilet paper. If you underwent a bowel prep for your procedure, you may not have a normal bowel movement for a few days.  Please Note:  You might notice some irritation and congestion in your nose or some drainage.  This is from the oxygen used during your procedure.  There is no need for concern and it should clear up in a day or so.  SYMPTOMS TO REPORT IMMEDIATELY:  Following lower endoscopy (colonoscopy or flexible sigmoidoscopy):  Excessive amounts of blood in the stool  Significant tenderness or worsening of abdominal pains  Swelling of the abdomen that is new, acute  Fever of 100F or higher  For urgent or emergent issues, a gastroenterologist can be reached at any hour by calling [phone]. Do not use MyChart messaging for urgent concerns.    DIET:  We  do recommend a small meal at first, but then you may proceed to your regular diet.  Drink plenty of fluids but you should avoid alcoholic beverages for 24 hours.  ACTIVITY:  You should plan to take it easy for the rest of today and you should NOT DRIVE or use heavy machinery until tomorrow (because of the sedation medicines used during the test).    FOLLOW UP: Our staff will call the number listed on your records 48-72 hours following your procedure to check on you and address any questions or concerns that you may have regarding the information given to you following your procedure. If we do not reach you, we will leave a message.  We will attempt to reach you two times.  During this call, we will ask if you have developed any symptoms of COVID 19. If you develop any symptoms (ie: fever, flu-like symptoms, shortness of breath, cough etc.) before then, please call [phone].  If you test positive for Covid 19 in the 2 weeks post procedure, please call and report this information to [place].    If any biopsies were taken you will be contacted by phone or by letter within the next 1-3 weeks.  Please call us at [phone] if you have not heard about the biopsies in 3 weeks.    SIGNATURES/CONFIDENTIALITY: You and/or your care partner have signed paperwork which will be entered into your electronic medical record.  These signatures attest to the fact that  that the information above on your After Visit Summary has been reviewed and is understood.  Full responsibility of the confidentiality of this discharge information lies with you and/or your care-partner.

## 2021-05-15 ENCOUNTER — Telehealth: Payer: Self-pay

## 2021-05-15 NOTE — Telephone Encounter (Signed)
First post procedure follow up call, no answer 

## 2021-05-15 NOTE — Telephone Encounter (Signed)
  Follow up Call-  Call back number 05/13/2021  Post procedure Call Back phone  # [phone]  Permission to leave phone message Yes  Some recent data might be hidden     Patient questions:  Do you have a fever, pain , or abdominal swelling? No. Pain Score  0 *  Have you tolerated food without any problems? Yes.    Have you been able to return to your normal activities? Yes.    Do you have any questions about your discharge instructions: Diet   No. Medications  No. Follow up visit  No.  Do you have questions or concerns about your Care? No.  Actions: * If pain score is 4 or above: No action needed, pain <4. Have you developed a fever since your procedure? no  2.   Have you had an respiratory symptoms (SOB or cough) since your procedure? no  3.   Have you tested positive for COVID 19 since your procedure no  4.   Have you had any family members/close contacts diagnosed with the COVID 19 since your procedure?  no   If yes to any of these questions please route to Joylene John, RN and Joella Prince, RN

## 2021-05-27 ENCOUNTER — Encounter: Payer: Self-pay | Admitting: Gastroenterology

## 2022-05-09 IMAGING — CR DG RIBS W/ CHEST 3+V*L*
5 series · 5 of 5 positions shown · non-contrast
Comparison: None.

CLINICAL DATA: Left posterior rib pain after falling onto his back
while playing tennis.

EXAM:
LEFT RIBS AND CHEST - 3+ VIEW

[w chest pa]
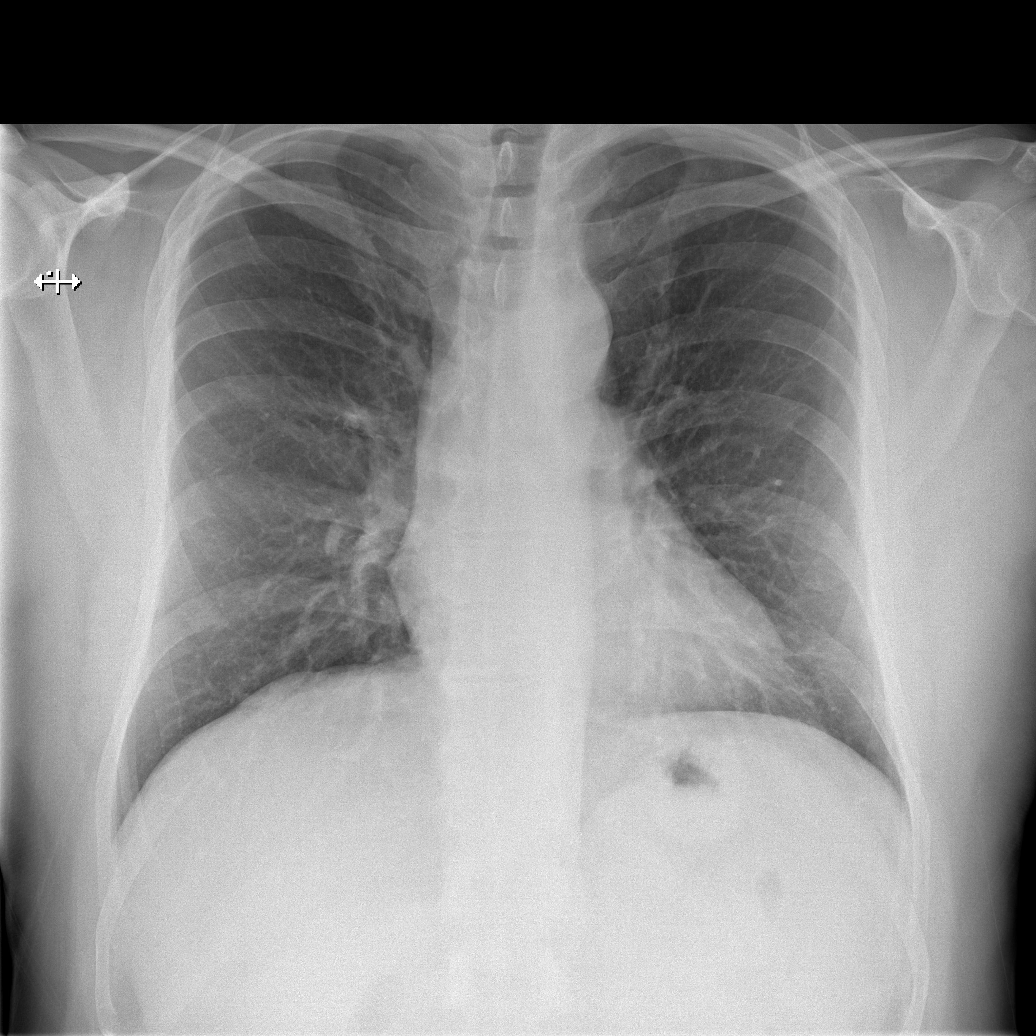

[w ribs ap upper left]
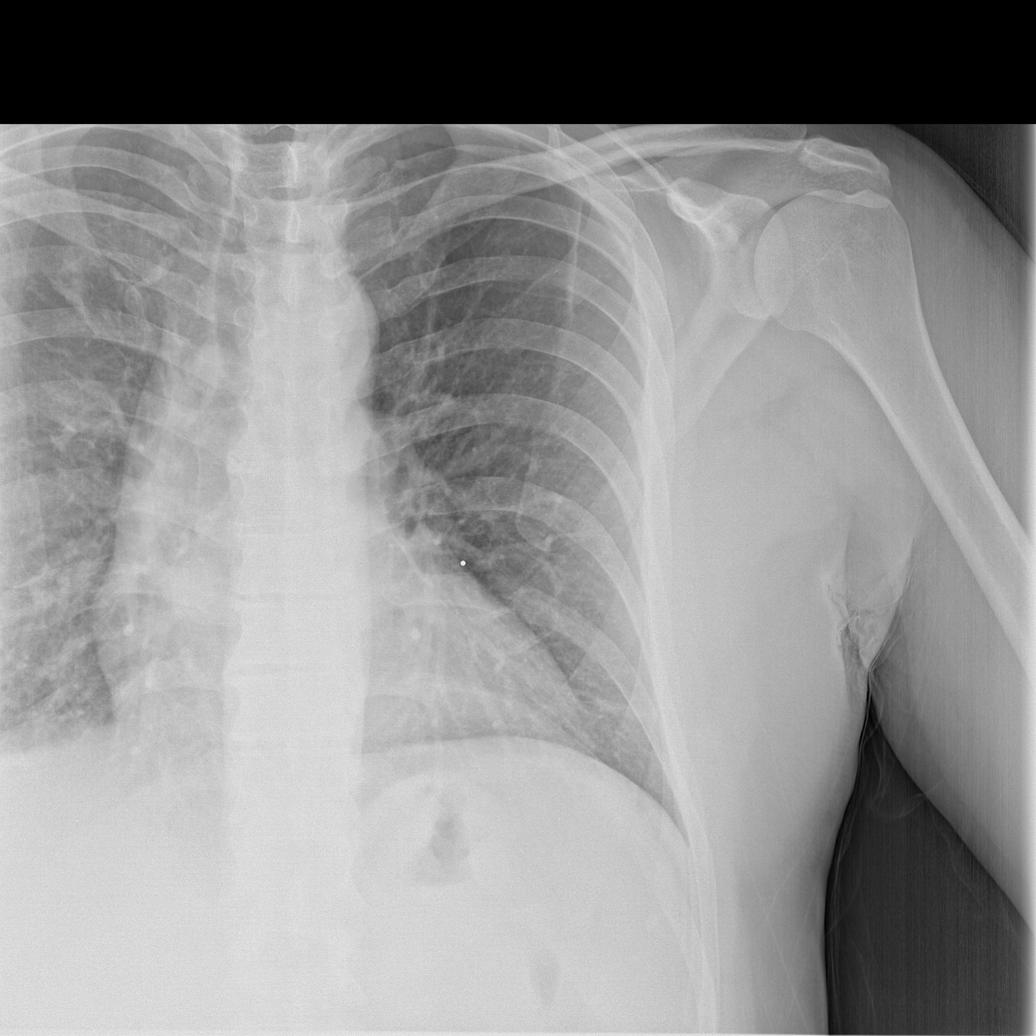

[w ribs ap lower left]
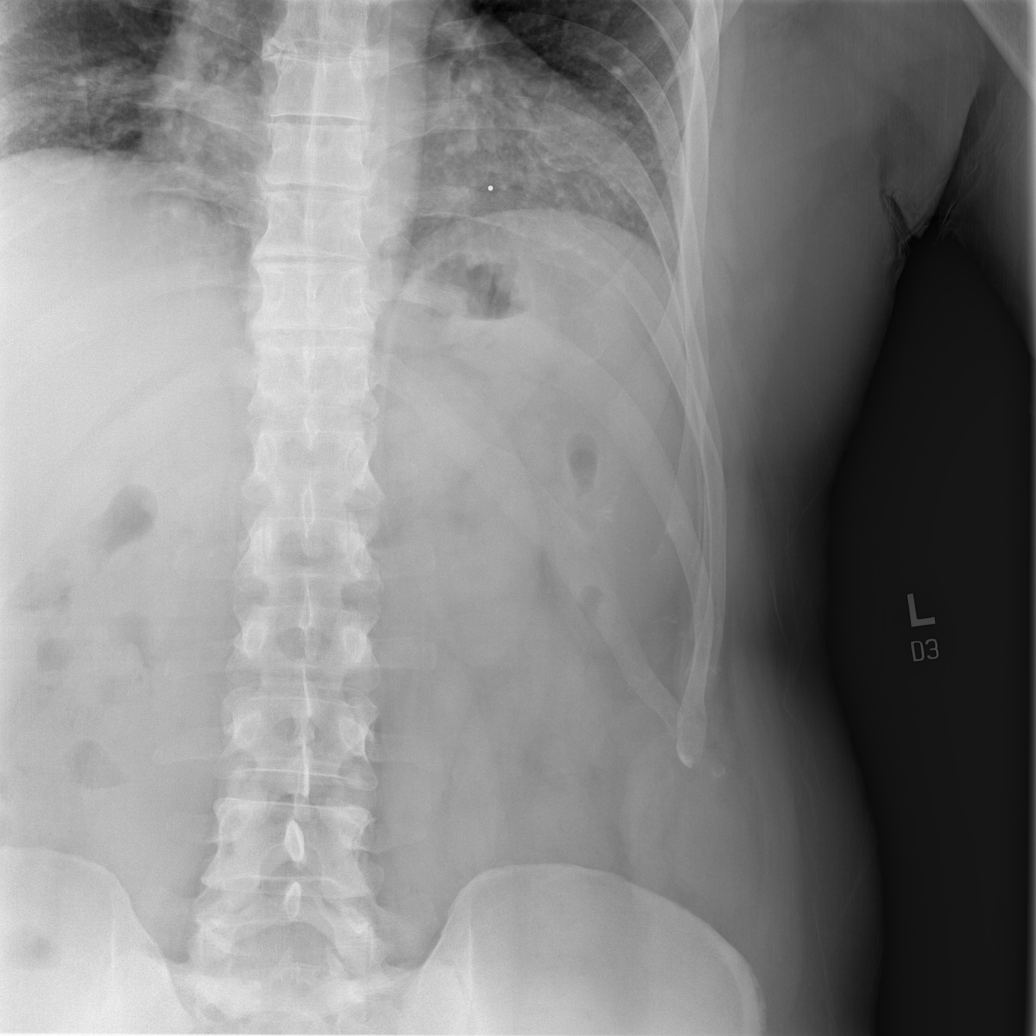

[w ribs obl left (1 of 2)]
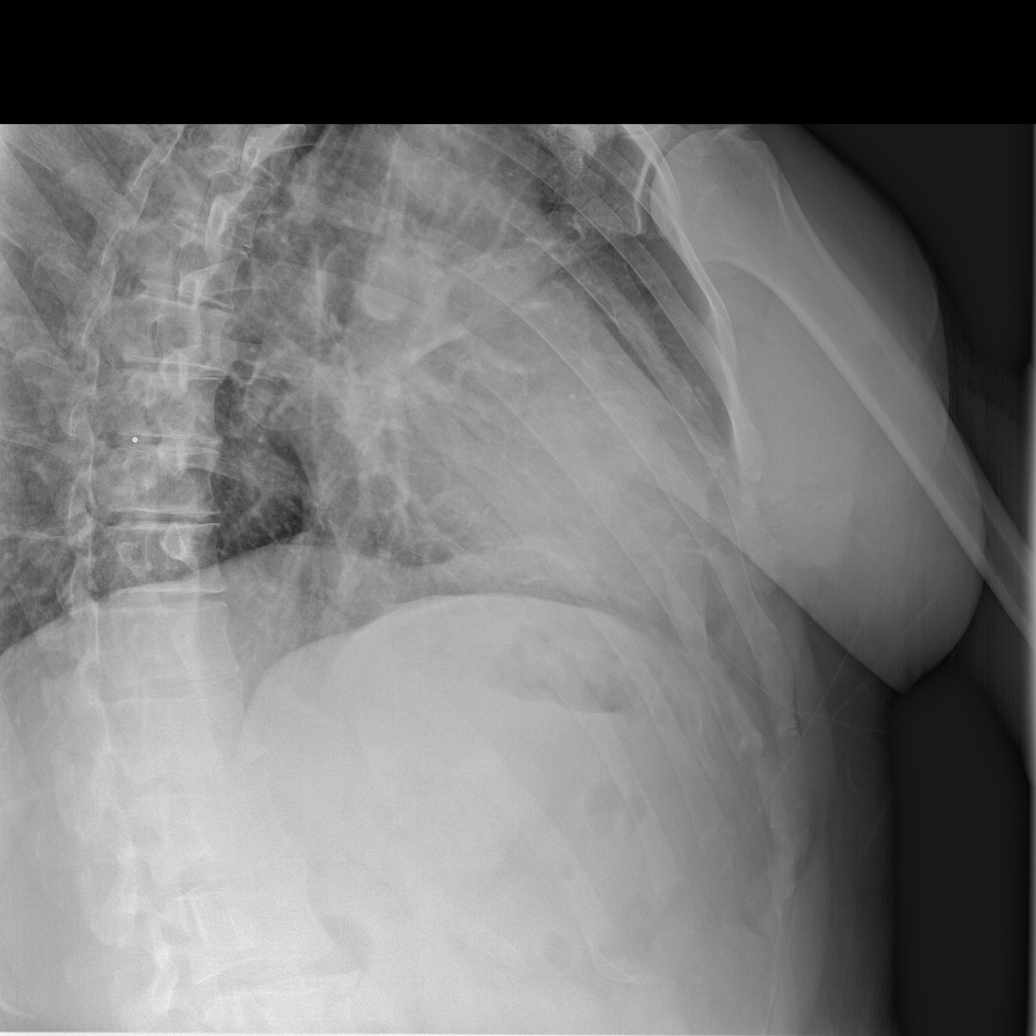

[w ribs obl left (2 of 2)]
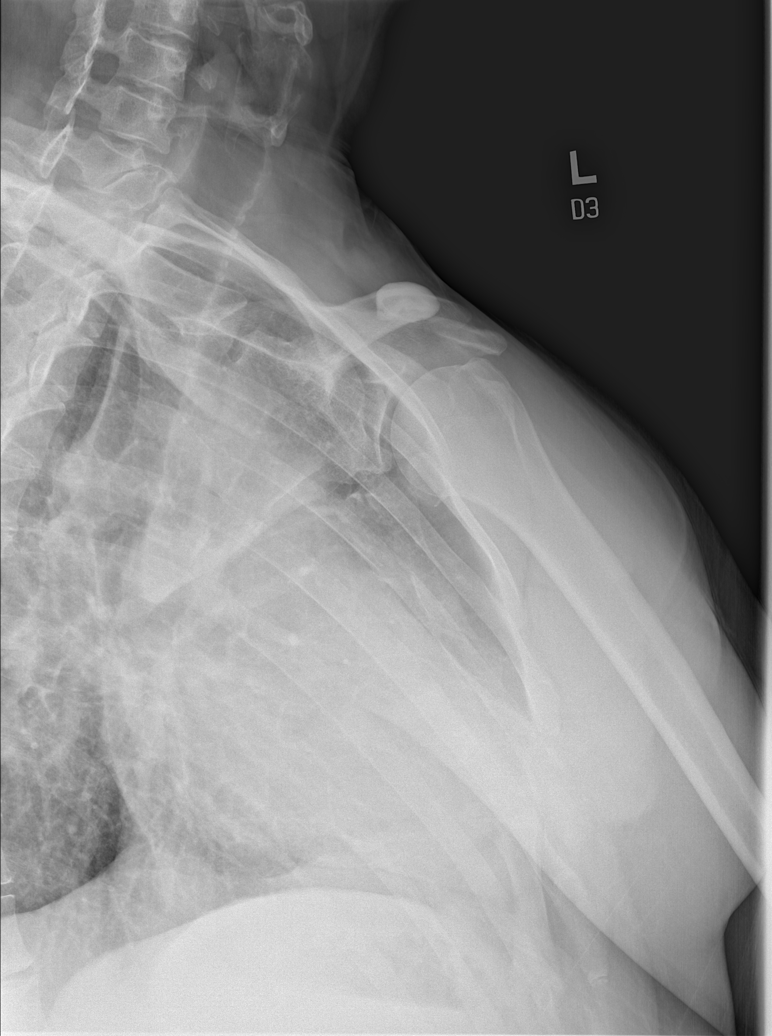

[5 of 5 positions shown; findings below may reference images not displayed]

FINDINGS: No fracture or other bone lesions are seen involving the ribs. There
is no evidence of pneumothorax or pleural effusion. Both lungs are
clear. Heart size and mediastinal contours are within normal limits.
IMPRESSION: Negative.

## 2022-08-28 DIAGNOSIS — R053 Chronic cough: Secondary | ICD-10-CM | POA: Diagnosis not present

## 2022-08-28 DIAGNOSIS — U071 COVID-19: Secondary | ICD-10-CM | POA: Diagnosis not present

## 2022-10-07 DIAGNOSIS — K219 Gastro-esophageal reflux disease without esophagitis: Secondary | ICD-10-CM | POA: Diagnosis not present

## 2022-10-07 DIAGNOSIS — Z125 Encounter for screening for malignant neoplasm of prostate: Secondary | ICD-10-CM | POA: Diagnosis not present

## 2022-10-07 DIAGNOSIS — E785 Hyperlipidemia, unspecified: Secondary | ICD-10-CM | POA: Diagnosis not present

## 2022-10-14 DIAGNOSIS — R7401 Elevation of levels of liver transaminase levels: Secondary | ICD-10-CM | POA: Diagnosis not present

## 2022-10-14 DIAGNOSIS — Z1331 Encounter for screening for depression: Secondary | ICD-10-CM | POA: Diagnosis not present

## 2022-10-14 DIAGNOSIS — G4733 Obstructive sleep apnea (adult) (pediatric): Secondary | ICD-10-CM | POA: Diagnosis not present

## 2022-10-14 DIAGNOSIS — Z1339 Encounter for screening examination for other mental health and behavioral disorders: Secondary | ICD-10-CM | POA: Diagnosis not present

## 2022-10-14 DIAGNOSIS — R82998 Other abnormal findings in urine: Secondary | ICD-10-CM | POA: Diagnosis not present

## 2022-10-14 DIAGNOSIS — Z23 Encounter for immunization: Secondary | ICD-10-CM | POA: Diagnosis not present

## 2022-10-14 DIAGNOSIS — E785 Hyperlipidemia, unspecified: Secondary | ICD-10-CM | POA: Diagnosis not present

## 2022-10-14 DIAGNOSIS — K219 Gastro-esophageal reflux disease without esophagitis: Secondary | ICD-10-CM | POA: Diagnosis not present

## 2022-10-14 DIAGNOSIS — Z0000 Encounter for general adult medical examination without abnormal findings: Secondary | ICD-10-CM | POA: Diagnosis not present

## 2022-10-15 ENCOUNTER — Other Ambulatory Visit: Payer: Self-pay | Admitting: Internal Medicine

## 2022-10-15 DIAGNOSIS — E785 Hyperlipidemia, unspecified: Secondary | ICD-10-CM

## 2023-08-16 DIAGNOSIS — F4320 Adjustment disorder, unspecified: Secondary | ICD-10-CM | POA: Diagnosis not present

## 2023-09-08 DIAGNOSIS — F4320 Adjustment disorder, unspecified: Secondary | ICD-10-CM | POA: Diagnosis not present

## 2023-09-28 DIAGNOSIS — F4320 Adjustment disorder, unspecified: Secondary | ICD-10-CM | POA: Diagnosis not present

## 2023-10-05 ENCOUNTER — Other Ambulatory Visit: Payer: Self-pay | Admitting: Internal Medicine

## 2023-10-05 ENCOUNTER — Encounter: Payer: Self-pay | Admitting: Internal Medicine

## 2023-10-05 DIAGNOSIS — E785 Hyperlipidemia, unspecified: Secondary | ICD-10-CM

## 2023-10-20 ENCOUNTER — Ambulatory Visit
Admission: RE | Admit: 2023-10-20 | Discharge: 2023-10-20 | Disposition: A | Discharge status: Home / Self Care | Payer: No Typology Code available for payment source | Source: Ambulatory Visit | Attending: Internal Medicine | Admitting: Internal Medicine

## 2023-10-20 DIAGNOSIS — E785 Hyperlipidemia, unspecified: Secondary | ICD-10-CM

## 2023-10-22 DIAGNOSIS — Z125 Encounter for screening for malignant neoplasm of prostate: Secondary | ICD-10-CM | POA: Diagnosis not present

## 2023-10-22 DIAGNOSIS — E785 Hyperlipidemia, unspecified: Secondary | ICD-10-CM | POA: Diagnosis not present

## 2023-10-26 DIAGNOSIS — F4320 Adjustment disorder, unspecified: Secondary | ICD-10-CM | POA: Diagnosis not present

## 2023-10-29 DIAGNOSIS — Z1331 Encounter for screening for depression: Secondary | ICD-10-CM | POA: Diagnosis not present

## 2023-10-29 DIAGNOSIS — Z1339 Encounter for screening examination for other mental health and behavioral disorders: Secondary | ICD-10-CM | POA: Diagnosis not present

## 2023-10-29 DIAGNOSIS — I7121 Aneurysm of the ascending aorta, without rupture: Secondary | ICD-10-CM | POA: Diagnosis not present

## 2023-10-29 DIAGNOSIS — Z23 Encounter for immunization: Secondary | ICD-10-CM | POA: Diagnosis not present

## 2023-10-29 DIAGNOSIS — Z0000 Encounter for general adult medical examination without abnormal findings: Secondary | ICD-10-CM | POA: Diagnosis not present

## 2023-11-01 ENCOUNTER — Other Ambulatory Visit: Payer: BC Managed Care – PPO

## 2023-11-11 ENCOUNTER — Other Ambulatory Visit: Payer: Self-pay | Admitting: Internal Medicine

## 2023-11-11 DIAGNOSIS — N50811 Right testicular pain: Secondary | ICD-10-CM

## 2023-11-18 ENCOUNTER — Ambulatory Visit
Admission: RE | Admit: 2023-11-18 | Discharge: 2023-11-18 | Disposition: A | Discharge status: Home / Self Care | Source: Ambulatory Visit | Attending: Internal Medicine | Admitting: Internal Medicine

## 2023-11-18 DIAGNOSIS — N50811 Right testicular pain: Secondary | ICD-10-CM | POA: Diagnosis not present

## 2023-11-29 DIAGNOSIS — F4320 Adjustment disorder, unspecified: Secondary | ICD-10-CM | POA: Diagnosis not present

## 2023-12-01 DIAGNOSIS — N50811 Right testicular pain: Secondary | ICD-10-CM | POA: Diagnosis not present

## 2023-12-29 DIAGNOSIS — F4320 Adjustment disorder, unspecified: Secondary | ICD-10-CM | POA: Diagnosis not present

## 2024-02-02 DIAGNOSIS — F4320 Adjustment disorder, unspecified: Secondary | ICD-10-CM | POA: Diagnosis not present

## 2024-03-10 ENCOUNTER — Encounter: Payer: Self-pay | Admitting: Advanced Practice Midwife

## 2024-03-13 DIAGNOSIS — F4320 Adjustment disorder, unspecified: Secondary | ICD-10-CM | POA: Diagnosis not present

## 2024-04-18 DIAGNOSIS — F4320 Adjustment disorder, unspecified: Secondary | ICD-10-CM | POA: Diagnosis not present
